# Patient Record
Sex: Female | Born: 2000 | Race: White | Hispanic: No | Marital: Single | Smoking: Never smoker
Health system: Southern US, Community
[De-identification: ages and names within clinical notes are randomized; demographics above are authoritative.]

## PROBLEM LIST (undated history)

## (undated) DIAGNOSIS — R5600 Simple febrile convulsions: Secondary | ICD-10-CM

## (undated) HISTORY — PX: WISDOM TOOTH EXTRACTION: SHX21

## (undated) HISTORY — PX: OTHER SURGICAL HISTORY: SHX169

---
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
## 2018-05-24 ENCOUNTER — Telehealth (INDEPENDENT_AMBULATORY_CARE_PROVIDER_SITE_OTHER): Payer: Self-pay

## 2018-05-24 ENCOUNTER — Other Ambulatory Visit (INDEPENDENT_AMBULATORY_CARE_PROVIDER_SITE_OTHER): Payer: Self-pay | Admitting: Neurology

## 2018-05-24 DIAGNOSIS — G40B09 Juvenile myoclonic epilepsy, not intractable, without status epilepticus: Secondary | ICD-10-CM

## 2018-05-24 MED ORDER — LEVETIRACETAM ER 750 MG PO TB24: ORAL_TABLET | ORAL | 1 refills | Status: DC

## 2018-05-24 NOTE — Telephone Encounter (Signed)
Spoke to mom and she states that patient is in [place] at school, told mom that she hadn't been here in almost a year and that we needed to get her scheduled for a follow up. She agreed to an appt in August, I let her know that I would send in the rx to get her to her appt.

## 2018-05-24 NOTE — Telephone Encounter (Signed)
Left vm for mom to return my call in regards to rx refill. Patient needs appt

## 2018-07-11 ENCOUNTER — Encounter (INDEPENDENT_AMBULATORY_CARE_PROVIDER_SITE_OTHER): Payer: Self-pay | Admitting: Neurology

## 2018-07-11 ENCOUNTER — Ambulatory Visit (INDEPENDENT_AMBULATORY_CARE_PROVIDER_SITE_OTHER): Payer: BC Managed Care – PPO | Admitting: Neurology

## 2018-07-11 VITALS — BP 108/74 | HR 68 | Ht 65.95 in | Wt 142.6 lb

## 2018-07-11 DIAGNOSIS — G40B09 Juvenile myoclonic epilepsy, not intractable, without status epilepticus: Secondary | ICD-10-CM

## 2018-07-11 MED ORDER — LEVETIRACETAM ER 750 MG PO TB24: ORAL_TABLET | ORAL | 3 refills | Status: DC

## 2018-07-11 NOTE — Progress Notes (Signed)
Patient: Erica Osborne MRN: [id number] Sex: female DOB: 04/23/2001  Provider: Keturah Shaverseza Kai Railsback, MD [facility]  Note type: Routine return visit  Referral Source: [name], MD [facility] Chief Complaint: Juvenile Myoclonic Epilepsy  History of Present Illness: Erica Osborne is a 17 y.o. female is here for follow-up management of seizure disorder. She has a diagnosis of generalized seizure disorder and probably  Juvenile myoclonic epilepsy based on her clinical seizure activity and her initial abnormal EEG during photic stimulation which was done in 2016. She has been on Keppra since then and has had no more clinical seizure activity and her follow-up EEG was normal which was done in December 2017. Currently she is taking Keppra ER 1500 mg every night, has been tolerating medication well with no side effects. She was last seen in July 2018 and since she had normal EEG and no more clinical seizure activity, she was recommended to gradually taper and discontinue medication but since she was going to [place], she did not want to discontinue medication at that time.  She and her family would like to continue medication for the next couple of years.  Review of Systems: 12 system review as per HPI, otherwise negative.  Past Medical History:  Diagnosis Date  . Febrile seizure (HCC)   . Febrile seizure (HCC)    Hospitalizations: No., Head Injury: No., Nervous System Infections: No., Immunizations up to date: Yes.     Surgical History Past Surgical History:  Procedure Laterality Date  . growth head N/A   . WISDOM TOOTH EXTRACTION      Family History family history includes Cancer in her maternal grandfather; Heart Problems in her paternal grandfather; Seizures (age of onset: 4123) in her cousin.   Social History Social History   Socioeconomic History  . Marital status: Single    Spouse name: Not on  file  . Number of children: Not on file  . Years of education: Not on file  . Highest education level: Not on file  Occupational History  . Not on file  Social Needs  . Financial resource strain: Not on file  . Food insecurity:    Worry: Not on file    Inability: Not on file  . Transportation needs:    Medical: Not on file    Non-medical: Not on file  Tobacco Use  . Smoking status: Never Smoker  . Smokeless tobacco: Never Used  Substance and Sexual Activity  . Alcohol use: No    Alcohol/week: 0.0 oz  . Drug use: No  . Sexual activity: Never  Lifestyle  . Physical activity:    Days per week: Not on file    Minutes per session: Not on file  . Stress: Not on file  Relationships  . Social connections:    Talks on phone: Not on file    Gets together: Not on file    Attends religious service: Not on file    Active member of club or organization: Not on file    Attends meetings of clubs or organizations: Not on file    Relationship status: Not on file  Other Topics Concern  . 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 She lives with her parents and does well in school. She enjoys lacrosse, traveling, and swimming.      The medication list was reviewed  and reconciled. All changes or newly prescribed medications were explained.  A complete medication list was provided to the patient/caregiver.  No Known Allergies  Physical Exam BP 108/74   Pulse 68   Ht 5' 5.95" (1.675 m)   Wt 142 lb 10.2 oz (64.7 kg)   BMI 23.06 kg/m  Gen: Awake, alert, not in distress Skin: No rash, No neurocutaneous stigmata. HEENT: Normocephalic,  no conjunctival injection, nares patent, mucous membranes moist, oropharynx clear. Neck: Supple, no meningismus. No focal tenderness. Resp: Clear to auscultation bilaterally CV: Regular rate, normal S1/S2, no murmurs, no rubs Abd: BS present, abdomen soft, non-tender, non-distended. No hepatosplenomegaly  or mass Ext: Warm and well-perfused. No deformities, no muscle wasting, ROM full.  Neurological Examination: MS: Awake, alert, interactive. Normal eye contact, answered the questions appropriately, speech was fluent,  Normal comprehension.  Attention and concentration were normal. Cranial Nerves: Pupils were equal and reactive to light ( 5-105mm);  normal fundoscopic exam with sharp discs, visual field full with confrontation test; EOM normal, no nystagmus; no ptsosis, no double vision, intact facial sensation, face symmetric with full strength of facial muscles, hearing intact to finger rub bilaterally, palate elevation is symmetric, tongue protrusion is symmetric with full movement to both sides.  Sternocleidomastoid and trapezius are with normal strength. Tone-Normal Strength-Normal strength in all muscle groups DTRs-  Biceps Triceps Brachioradialis Patellar Ankle  R 2+ 2+ 2+ 2+ 2+  L 2+ 2+ 2+ 2+ 2+   Plantar responses flexor bilaterally, no clonus noted Sensation: Intact to light touch,  Romberg negative. Coordination: No dysmetria on FTN test. No difficulty with balance. Gait: Normal walk and run. Tandem gait was normal. Was able to perform toe walking and heel walking without difficulty.   Assessment and Plan 1. Juvenile myoclonic epilepsy, not intractable, without status epilepticus (HCC)    This is an 17 year old female with diagnosis of juvenile myoclonic epilepsy based on her initial EEG, currently on Keppra with good seizure control and no side effects.  She has no focal findings on her neurological examination. Since she would like to continue medication for now, there would be no indication to perform another EEG. She will continue the same dose of Keppra at 1500 mg every night. I also discussed the seizure triggers with patient again particularly lack of sleep and prolonged screen time or bright lights. I would like to see her in 1 year for follow-up visit or sooner if she  develops any seizure activity.  I recommend mother to call prior to her next appointment to schedule for an EEG and then a follow-up appointment.  She and her mother understood and agreed with the plan.  Meds ordered this encounter  Medications  . Levetiracetam 750 MG TB24    Sig: TAKE 2 TABLETS BY MOUTH DAILY AT BEDTIME    Dispense:  180 tablet    Refill:  3

## 2018-08-24 ENCOUNTER — Other Ambulatory Visit (INDEPENDENT_AMBULATORY_CARE_PROVIDER_SITE_OTHER): Payer: Self-pay | Admitting: Neurology

## 2018-08-24 DIAGNOSIS — G40B09 Juvenile myoclonic epilepsy, not intractable, without status epilepticus: Secondary | ICD-10-CM

## 2018-09-06 ENCOUNTER — Telehealth (INDEPENDENT_AMBULATORY_CARE_PROVIDER_SITE_OTHER): Payer: Self-pay | Admitting: Family

## 2018-09-06 NOTE — Telephone Encounter (Signed)
°  Who's calling (name and relationship to patient) : Cyndi (Mother) Best contact number: [phone] Provider they see: Inetta Fermo  Reason for call: Mom has a question regarding pt taking Melatonin. Mom wanted to know if it would interact with Keppra or any other rxs she is taking. Please advise.

## 2018-09-07 NOTE — Telephone Encounter (Signed)
I called mother and let her know that she may take 3 to 5 mg of melatonin that usually would not cause any interaction with Keppra.

## 2018-10-09 ENCOUNTER — Telehealth (INDEPENDENT_AMBULATORY_CARE_PROVIDER_SITE_OTHER): Payer: Self-pay | Admitting: Neurology

## 2018-10-09 DIAGNOSIS — G40B09 Juvenile myoclonic epilepsy, not intractable, without status epilepticus: Secondary | ICD-10-CM

## 2018-10-09 NOTE — Telephone Encounter (Signed)
Shanda Bumps with [facility] called stating we sent rx for patient, but they are not able to order this rx any longer. Please call her back at [phone]. Rufina Falco

## 2018-10-10 MED ORDER — LEVETIRACETAM ER 750 MG PO TB24: ORAL_TABLET | ORAL | 3 refills | Status: DC

## 2018-10-10 MED ORDER — LEVETIRACETAM 750 MG PO TABS: 750.0000 mg | ORAL_TABLET | Freq: Two times a day (BID) | ORAL | 3 refills | Status: DC

## 2018-10-10 NOTE — Telephone Encounter (Signed)
Resent the rx to the pharmacy in [place] where patient is

## 2018-10-10 NOTE — Telephone Encounter (Signed)
Called pharmacy they state the generic is on back order and has been for some time. They want to know if provider wants to switch it to something else. Please advise

## 2018-10-10 NOTE — Telephone Encounter (Signed)
I sent a prescription for regular Keppra 750 mg to take twice daily.  Please let mother or pharmacy know.

## 2019-03-12 ENCOUNTER — Telehealth (INDEPENDENT_AMBULATORY_CARE_PROVIDER_SITE_OTHER): Payer: Self-pay | Admitting: Neurology

## 2019-03-12 NOTE — Telephone Encounter (Signed)
°  Who's calling (name and relationship to patient) : Cyndi (Mother) Best contact number: [phone] Provider they see: Dr. Devonne Doughty  Reason for call: Mother stated that when she picked up pt's rx from pharm, it was generic Keppra. Mom stated rx is supposed to be generic Keppra XR.      PRESCRIPTION REFILL ONLY  Name of prescription: Keppra XR generic  Pharmacy:

## 2019-03-13 MED ORDER — LEVETIRACETAM ER 750 MG PO TB24: 1500.0000 mg | ORAL_TABLET | Freq: Every day | ORAL | 5 refills | Status: DC

## 2019-03-13 NOTE — Telephone Encounter (Signed)
I looked in patients chart and I didn't see the rx for ER, can you confirm.  thanks

## 2019-03-13 NOTE — Telephone Encounter (Signed)
Spoke to mom and let her know what Dr. Merri Brunette advised. She understood and stated they would do the 750 two times a day

## 2019-03-13 NOTE — Telephone Encounter (Signed)
The prescription for ER was previously sent to [place] The prescription for regular Keppra was previously in the [facility] pharmacy when there was a shortage of long-acting Keppra.  Some mother picked up this prescription. I will send a new prescription for Keppra ER to take 1500 mg every night but patient can continue 750 mg twice daily of the regular form of medicine which is exactly the same until she finished up and then she can pick up the new prescription next month.  Please let mother know.

## 2019-07-25 ENCOUNTER — Telehealth (INDEPENDENT_AMBULATORY_CARE_PROVIDER_SITE_OTHER): Payer: Self-pay | Admitting: Neurology

## 2019-07-25 ENCOUNTER — Other Ambulatory Visit (INDEPENDENT_AMBULATORY_CARE_PROVIDER_SITE_OTHER): Payer: Self-pay | Admitting: Neurology

## 2019-07-25 NOTE — Telephone Encounter (Signed)
°  Who's calling (name and relationship to patient) : Siegel,Cyndi Best contact number: [phone] Provider they see: Nab Reason for call: F/u scheduled for 9/18    PRESCRIPTION REFILL ONLY  Name of prescription:  Pharmacy:

## 2019-08-01 ENCOUNTER — Telehealth (INDEPENDENT_AMBULATORY_CARE_PROVIDER_SITE_OTHER): Payer: Self-pay | Admitting: Neurology

## 2019-08-01 MED ORDER — LEVETIRACETAM ER 750 MG PO TB24: 1500.0000 mg | ORAL_TABLET | Freq: Every day | ORAL | 3 refills | Status: DC

## 2019-08-01 NOTE — Telephone Encounter (Signed)
Who's calling (name and relationship to patient) : Erica Osborne (mom)  Best contact number: [phone]  Provider they see: Dr. Secundino Ginger Reason for call:  Mom called in stating that she had not received or heard anything back about getting the Levetiracetam filled. States that if it could be filled for 3 months that would be great, patient is in school [place]. Please advise  Call ID:      PRESCRIPTION REFILL ONLY  Name of prescription: Levetiracetam   Pharmacy:  [facility] on [address]

## 2019-08-01 NOTE — Telephone Encounter (Signed)
Called mom to confirm which pharmacy since patient is out of state

## 2019-08-01 NOTE — Telephone Encounter (Signed)
Mom called to confirm the pharmacy is [facility] on [address] in [place].

## 2019-08-01 NOTE — Telephone Encounter (Signed)
rx sent to pharmacy mom requested

## 2019-08-23 ENCOUNTER — Encounter (INDEPENDENT_AMBULATORY_CARE_PROVIDER_SITE_OTHER): Payer: Self-pay | Admitting: Neurology

## 2019-08-23 ENCOUNTER — Other Ambulatory Visit: Payer: Self-pay

## 2019-08-23 ENCOUNTER — Ambulatory Visit (INDEPENDENT_AMBULATORY_CARE_PROVIDER_SITE_OTHER): Payer: BC Managed Care – PPO | Admitting: Neurology

## 2019-08-23 VITALS — Ht 67.0 in

## 2019-08-23 DIAGNOSIS — G40B09 Juvenile myoclonic epilepsy, not intractable, without status epilepticus: Secondary | ICD-10-CM | POA: Diagnosis not present

## 2019-08-23 MED ORDER — LEVETIRACETAM ER 750 MG PO TB24: 1500.0000 mg | ORAL_TABLET | Freq: Every day | ORAL | 2 refills | Status: DC

## 2019-08-23 NOTE — Patient Instructions (Signed)
Continue with the same dose of Keppra at 1500 mg daily Have adequate sleep and limited screen time We will schedule for a sleep deprived EEG and an appointment on the same day at the end of December and if the EEG is normal then we may decrease the dose and gradually taper and discontinue the medication.

## 2019-08-23 NOTE — Progress Notes (Signed)
This is a Pediatric Specialist E-Visit follow up consult provided via WebEx Erica PiedraKalynn A Vanderslice consented to an E-Visit consult today.  Location of patient: Rachael FeeKalynn is at Home(location) Location of provider: Keturah Shaverseza Nabizadeh, Osborne is at Office (location) Patient was referred by Timothy LassoLentz, Erica Osborne   The following participants were involved in this E-Visit: Tresa EndoKelly, CMA              Erica Shaverseza Nabizadeh, Osborne Chief Complain/ Reason for E-Visit today: Seizures Total time on call: 25 minutes Follow up: 3 months   Patient: Erica Osborne MRN: [id number] Sex: female DOB: Jan 27, 2001  Provider: Keturah Shaverseza Nabizadeh, Osborne [facility] Care[facility]  Note type: Routine return visit History from: patient and [facility] chart Chief Complaint: Seizures  History of Present Illness: Erica Osborne is a 18 y.o. female is on WebEx for follow-up visit of seizure disorder.  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 She has been doing well and [place] in [place] in [place].  She usually sleeps well without any difficulty for around 9 hours.  She has no behavioral or mood issues and is happy with her progress with no other complaints or concerns.  Review of Systems: 12 system review as per HPI, otherwise negative.  Past Medical History:  Diagnosis Date   Febrile seizure (HCC)    Febrile seizure (HCC)    Hospitalizations: No., Head Injury: No., Nervous System Infections: No., Immunizations up to date: Yes.    Surgical History Past Surgical History:  Procedure Laterality Date   growth head N/A    WISDOM TOOTH EXTRACTION      Family History family history includes Cancer in her maternal grandfather; Heart Problems in her paternal grandfather; Seizures (age of onset: 5723) in her  cousin.   Social History Social History   Socioeconomic History   Marital status: Single    Spouse name: Not on file   Number of children: Not on file   Years of education: Not on file   Highest education level: Not on file  Occupational History   Not on file  Social Needs   Financial resource strain: Not on file   Food insecurity    Worry: Not on file    Inability: Not on file   Transportation needs    Medical: Not on file    Non-medical: Not on file  Tobacco Use   Smoking status: Never Smoker   Smokeless tobacco: Never Used  Substance and Sexual Activity   Alcohol use: No    Alcohol/week: 0.0 standard drinks   Drug use: No   Sexual activity: Never  Lifestyle   Physical activity    Days per week: Not on file    Minutes per session: Not on file   Stress: Not on file  Relationships   Social connections    Talks on phone: Not on file    Gets together: Not on file    Attends religious service: Not on file    Active member of club or organization: Not on file    Attends meetings of clubs or organizations: Not on file    Relationship status: Not on file  Other Topics Concern   Not on file  Social History Narrative   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 She lives with her parents and does well in school. She enjoys lacrosse, traveling, and swimming.      The medication list was reviewed and reconciled. All changes or newly prescribed medications were explained.  A complete medication list was provided to the patient/caregiver.  No Known Allergies  Physical Exam Ht 5\' 7"  (1.702 m) Comment: patient reported   BMI 22.34 kg/m  Her limited neurological exam on WebEx is normal.  She was awake, alert, follows instructions appropriately with normal comprehension and fluent speech.  She had normal cranial nerve exam with symmetric face and no nystagmus.  She had normal walk with no coordination or balance issues.  She had no dysmetria on  finger-to-nose testing.  She had no tremor.  She had normal range of motion with no limitation of activity.  Assessment and Plan 1. Juvenile myoclonic epilepsy, not intractable, without status epilepticus ([place])    This is a 18 year old female with history of generalized seizure disorder at 18 years of age with some abnormal discharges on her initial EEG, started on Keppra with no more seizure activity and doing well since then.  Currently she is on moderate dose of Keppra with good seizure control. Recommend to continue the same dose of Keppra which would be Keppra ER 1500 mg daily. She will continue with appropriate hydration and sleep and limited screen time. If there is any seizure she will call my office and let me know Since she has been seizure-free for a few years without any issues and her last EEG was normal in 2017, she would be able to gradually taper and discontinue medication but I would like to perform an EEG prior to that and then if it is normal, gradually taper the Keppra. I will schedule her for a sleep deprived EEG at the end of December when she is in town and then if it is normal we will gradually taper and discontinue medication.  She understood and agreed with the plan.   Meds ordered this encounter  Medications   Levetiracetam 750 MG TB24    Sig: Take 2 tablets (1,500 mg total) by mouth at bedtime.    Dispense:  180 tablet    Refill:  2   Orders Placed This Encounter  Procedures   Child sleep deprived EEG    Standing Status:   Future    Standing Expiration Date:   08/22/2020    Scheduling Instructions:     To be scheduled on the same day with the next appointment on December 29

## 2019-12-03 ENCOUNTER — Ambulatory Visit (INDEPENDENT_AMBULATORY_CARE_PROVIDER_SITE_OTHER): Payer: BC Managed Care – PPO | Admitting: Neurology

## 2019-12-03 ENCOUNTER — Encounter (INDEPENDENT_AMBULATORY_CARE_PROVIDER_SITE_OTHER): Payer: Self-pay | Admitting: Neurology

## 2019-12-03 ENCOUNTER — Other Ambulatory Visit: Payer: Self-pay

## 2019-12-03 ENCOUNTER — Other Ambulatory Visit (INDEPENDENT_AMBULATORY_CARE_PROVIDER_SITE_OTHER): Payer: BC Managed Care – PPO

## 2019-12-03 VITALS — BP 102/68 | HR 88 | Ht 66.5 in | Wt 135.8 lb

## 2019-12-03 DIAGNOSIS — G40B09 Juvenile myoclonic epilepsy, not intractable, without status epilepticus: Secondary | ICD-10-CM

## 2019-12-03 MED ORDER — LEVETIRACETAM ER 500 MG PO TB24: 1000.0000 mg | ORAL_TABLET | Freq: Every day | ORAL | 0 refills | Status: AC

## 2019-12-03 NOTE — Progress Notes (Signed)
Patient: Erica Osborne MRN: [id number] Sex: female DOB: 06/22/2001  Provider: Keturah Shavers, MD Location of Care: [facility]  Note type: Routine return visit  Referral Source: Timothy Lasso, MD History from: mother, patient and [facility] chart Chief Complaint: Juvenile Myoclonic Epilepsy/EEG Results  History of Present Illness: Erica Osborne is a 18 y.o. female is here for follow-up management of seizure disorder and discussing the EEG result and to discuss tapering and discontinuing medication if possible. She has a diagnosis of generalized seizure disorder and juvenile myoclonic epilepsy since 2016 based on her initial EEG abnormality, has been on Keppra 1500 mg daily with good seizure control and no clinical seizure activity since then.  She has been tolerating medication well with no side effects. Her last EEG in 2017 was normal and as mentioned she has not had any more clinical seizure activity since then and currently she is in college in [place]. She was seen a few months ago and she was recommended to have another follow-up EEG in a few months and then will discuss if he would be able to taper and discontinue her seizure medication. Her EEG today did not show any abnormal discharges with normal background and no seizure activity.  She usually sleeps well without any difficulty and she has no other behavioral or mood issues and doing well otherwise.   Review of Systems: Review of system as per HPI, otherwise negative.  Past Medical History:  Diagnosis Date  . Febrile seizure (HCC)   . Febrile seizure (HCC)    Hospitalizations: No., Head Injury: No., Nervous System Infections: No., Immunizations up to date: Yes.     Surgical History Past Surgical History:  Procedure Laterality Date  . growth head N/A   . WISDOM TOOTH EXTRACTION      Family History family history includes Cancer in her maternal grandfather; Heart Problems in her paternal grandfather; Seizures  (age of onset: 76) in her cousin.   Social History Social History   Socioeconomic History  . Marital status: Single    Spouse name: Not on file  . Number of children: Not on file  . Years of education: Not on file  . Highest education level: Not on file  Occupational History  . Not on file  Tobacco Use  . Smoking status: Never Smoker  . Smokeless tobacco: Never Used  Substance and Sexual Activity  . Alcohol use: No    Alcohol/week: 0.0 standard drinks  . Drug use: No  . Sexual activity: Never  Other Topics Concern  . 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 She lives with her parents and does well in school. She enjoys lacrosse, traveling, and swimming.    Social Determinants of Health   Financial Resource Strain:   . Difficulty of Paying Living Expenses: Not on file  Food Insecurity:   . Worried About Programme researcher, broadcasting/film/video in the Last Year: Not on file  . Ran Out of Food in the Last Year: Not on file  Transportation Needs:   . Lack of Transportation (Medical): Not on file  . Lack of Transportation (Non-Medical): Not on file  Physical Activity:   . Days of Exercise per Week: Not on file  . Minutes of Exercise per Session: Not on file  Stress:   . Feeling of Stress : Not on file  Social Connections:   . Frequency of Communication with Friends and Family: Not  on file  . Frequency of Social Gatherings with Friends and Family: Not on file  . Attends Religious Services: Not on file  . Active Member of Clubs or Organizations: Not on file  . Attends Archivist Meetings: Not on file  . Marital Status: Not on file     No Known Allergies  Physical Exam BP 102/68   Pulse 88   Ht 5' 6.5" (1.689 m)   Wt 135 lb 12.9 oz (61.6 kg)   BMI 21.59 kg/m  Gen: Awake, alert, not in distress Skin: No rash, No neurocutaneous stigmata. HEENT: Normocephalic, no dysmorphic features, no conjunctival injection, nares patent, mucous  membranes moist, oropharynx clear. Neck: Supple, no meningismus. No focal tenderness. Resp: Clear to auscultation bilaterally CV: Regular rate, normal S1/S2, no murmurs, no rubs Abd: BS present, abdomen soft, non-tender, non-distended. No hepatosplenomegaly or mass Ext: Warm and well-perfused. No deformities, no muscle wasting, ROM full.  Neurological Examination: MS: Awake, alert, interactive. Normal eye contact, answered the questions appropriately, speech was fluent,  Normal comprehension.  Attention and concentration were normal. Cranial Nerves: Pupils were equal and reactive to light ( 5-79mm);  normal fundoscopic exam with sharp discs, visual field full with confrontation test; EOM normal, no nystagmus; no ptsosis, no double vision, intact facial sensation, face symmetric with full strength of facial muscles, hearing intact to finger rub bilaterally, palate elevation is symmetric, tongue protrusion is symmetric with full movement to both sides.  Sternocleidomastoid and trapezius are with normal strength. Tone-Normal Strength-Normal strength in all muscle groups DTRs-  Biceps Triceps Brachioradialis Patellar Ankle  R 2+ 2+ 2+ 2+ 2+  L 2+ 2+ 2+ 2+ 2+   Plantar responses flexor bilaterally, no clonus noted Sensation: Intact to light touch, Romberg negative. Coordination: No dysmetria on FTN test. No difficulty with balance. Gait: Normal walk and run. Tandem gait was normal. Was able to perform toe walking and heel walking without difficulty.   Assessment and Plan 1. 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  Currently she is on Keppra 1500 mg daily without any side effects. We discussed with patient and mother that  since she has not had any clinical seizure activity for the past few years with normal EEGs, it would be most likely safe to gradually taper and discontinue her seizure medication although there is always possibility of more seizure activity during tapering and stopping seizure medications. Recommend to gradually decrease the dose of Keppra to 1000 mg daily for 1 month then 500 mg daily for 1 month and then 500 mg every other day for 1 month and then discontinue the medication. If there is any problem during tapering of medication, she may call my office and let me know otherwise she will continue with plan and will continue follow-up with her PCP and no follow-up needed with neurology although if there is any concern for any kind of seizure activity, we may need to schedule her for a follow-up EEG off of medication otherwise no follow-up visit needed.  She and her mother understood and agreed with the plan. I discussed with patient and her mother the importance of adequate sleep and limited screen time as the main triggers to prevent from possible seizure activity.  I spent 40 minutes with patient  and her mother, more than 50% time spent for counseling and coordination of care.   Meds ordered this encounter  Medications  . levETIRAcetam (KEPPRA XR) 500 MG 24 hr tablet    Sig: Take 2 tablets (1,000 mg total) by mouth daily.    Dispense:  180 tablet    Refill:  0

## 2019-12-03 NOTE — Procedures (Signed)
Patient:  Erica Osborne   Sex: female  DOB:  2001-11-30  Date of study: 12/03/2019  Clinical history: This is a 18 year old female with diagnosis of generalized seizure disorder and possible juvenile myoclonic epilepsy since 2016 with normal clinical seizure activity.  This is a follow-up EEG for evaluation of epileptiform discharges.  Medication: Keppra  Procedure: The tracing was carried out on a 32 channel digital Cadwell recorder reformatted into 16 channel montages with 1 devoted to EKG.  The 10 /20 international system electrode placement was used. Recording was done during awake, drowsiness and sleep states. Recording time 43 minutes.   Description of findings: Background rhythm consists of amplitude of 60 microvolt and frequency of 10 hertz posterior dominant rhythm. There was normal anterior posterior gradient noted. Background was well organized, continuous and symmetric with no focal slowing. There was muscle artifact noted. During drowsiness and sleep there was gradual decrease in background frequency noted. During the early stages of sleep there were symmetrical sleep spindles and vertex sharp waves noted.  Hyperventilation resulted in slowing of the background activity. Photic stimulation using stepwise increase in photic frequency resulted in bilateral symmetric driving response. Throughout the recording there were no focal or generalized epileptiform activities in the form of spikes or sharps noted. There were no transient rhythmic activities or electrographic seizures noted. One lead EKG rhythm strip revealed sinus rhythm at a rate of 60 bpm.  Impression: This EEG is normal during awake and asleep states. Please note that normal EEG does not exclude epilepsy, clinical correlation is indicated.     Teressa Lower, MD

## 2019-12-03 NOTE — Progress Notes (Signed)
OP sleep deprived EEG completed in CN office, results pending.

## 2019-12-03 NOTE — Patient Instructions (Addendum)
Your EEG is normal Since there has been no clinical seizure activity and your EEGs are normal, we will try to gradually discontinue Keppra as follow: 1000 mg nightly for 1 month 500 mg nightly for 1 month 500 mg every other night for 1 month. Then stop the medication If there is any problem during tapering, call the office and let me know otherwise no appointment needed with neurology and continue follow-up with your PCP.
